# Patient Record
Sex: Female | Born: 1935 | Race: White | Hispanic: No | Smoking: Never smoker
Health system: Southern US, Community
[De-identification: ages and names within clinical notes are randomized; demographics above are authoritative.]

## PROBLEM LIST (undated history)

## (undated) DIAGNOSIS — M1990 Unspecified osteoarthritis, unspecified site: Secondary | ICD-10-CM

## (undated) DIAGNOSIS — I341 Nonrheumatic mitral (valve) prolapse: Secondary | ICD-10-CM

## (undated) DIAGNOSIS — R079 Chest pain, unspecified: Secondary | ICD-10-CM

## (undated) DIAGNOSIS — N3010 Interstitial cystitis (chronic) without hematuria: Secondary | ICD-10-CM

## (undated) DIAGNOSIS — I10 Essential (primary) hypertension: Secondary | ICD-10-CM

## (undated) HISTORY — DX: Essential (primary) hypertension: I10

## (undated) HISTORY — DX: Interstitial cystitis (chronic) without hematuria: N30.10

## (undated) HISTORY — DX: Nonrheumatic mitral (valve) prolapse: I34.1

## (undated) HISTORY — DX: Chest pain, unspecified: R07.9

## (undated) HISTORY — DX: Unspecified osteoarthritis, unspecified site: M19.90

---

## 1999-02-26 ENCOUNTER — Other Ambulatory Visit: Admission: RE | Admit: 1999-02-26 | Discharge: 1999-02-26 | Payer: Self-pay | Admitting: *Deleted

## 2000-04-10 ENCOUNTER — Other Ambulatory Visit: Admission: RE | Admit: 2000-04-10 | Discharge: 2000-04-10 | Payer: Self-pay | Admitting: *Deleted

## 2002-01-21 HISTORY — PX: CARDIAC CATHETERIZATION: SHX172

## 2002-05-26 ENCOUNTER — Ambulatory Visit (HOSPITAL_COMMUNITY): Admission: RE | Admit: 2002-05-26 | Discharge: 2002-05-26 | Payer: Self-pay | Admitting: Cardiology

## 2004-09-17 ENCOUNTER — Ambulatory Visit: Payer: Self-pay | Admitting: Internal Medicine

## 2004-09-28 ENCOUNTER — Ambulatory Visit: Payer: Self-pay | Admitting: Internal Medicine

## 2004-09-28 ENCOUNTER — Encounter (INDEPENDENT_AMBULATORY_CARE_PROVIDER_SITE_OTHER): Payer: Self-pay | Admitting: Specialist

## 2004-12-27 ENCOUNTER — Ambulatory Visit: Payer: Self-pay | Admitting: Internal Medicine

## 2005-01-18 ENCOUNTER — Ambulatory Visit: Payer: Self-pay | Admitting: Internal Medicine

## 2005-01-18 ENCOUNTER — Encounter (INDEPENDENT_AMBULATORY_CARE_PROVIDER_SITE_OTHER): Payer: Self-pay | Admitting: Specialist

## 2006-03-21 ENCOUNTER — Ambulatory Visit: Payer: Self-pay | Admitting: Internal Medicine

## 2006-04-04 ENCOUNTER — Ambulatory Visit: Payer: Self-pay | Admitting: Internal Medicine

## 2006-04-04 ENCOUNTER — Encounter (INDEPENDENT_AMBULATORY_CARE_PROVIDER_SITE_OTHER): Payer: Self-pay | Admitting: *Deleted

## 2008-03-25 ENCOUNTER — Encounter (INDEPENDENT_AMBULATORY_CARE_PROVIDER_SITE_OTHER): Payer: Self-pay | Admitting: *Deleted

## 2008-04-21 ENCOUNTER — Ambulatory Visit: Payer: Self-pay | Admitting: Internal Medicine

## 2008-05-06 ENCOUNTER — Encounter: Payer: Self-pay | Admitting: Internal Medicine

## 2008-05-06 ENCOUNTER — Ambulatory Visit: Payer: Self-pay | Admitting: Internal Medicine

## 2008-05-09 ENCOUNTER — Encounter: Payer: Self-pay | Admitting: Internal Medicine

## 2009-08-11 ENCOUNTER — Ambulatory Visit (HOSPITAL_COMMUNITY): Admission: EM | Admit: 2009-08-11 | Discharge: 2009-08-11 | Payer: Self-pay | Admitting: Emergency Medicine

## 2009-09-07 ENCOUNTER — Encounter: Payer: Self-pay | Admitting: Internal Medicine

## 2009-09-12 ENCOUNTER — Encounter: Payer: Self-pay | Admitting: Internal Medicine

## 2010-04-07 LAB — COMPREHENSIVE METABOLIC PANEL
ALT: 15 U/L (ref 0–35)
AST: 25 U/L (ref 0–37)
Albumin: 3.7 g/dL (ref 3.5–5.2)
Alkaline Phosphatase: 50 U/L (ref 39–117)
BUN: 12 mg/dL (ref 6–23)
CO2: 24 mEq/L (ref 19–32)
Calcium: 9.5 mg/dL (ref 8.4–10.5)
Chloride: 115 mEq/L — ABNORMAL HIGH (ref 96–112)
Creatinine, Ser: 0.71 mg/dL (ref 0.4–1.2)
GFR calc Af Amer: >60 mL/min (ref >60)
GFR calc non Af Amer: >60 mL/min (ref >60)
Glucose, Bld: 98 mg/dL (ref 70–99)
Potassium: 3.6 mEq/L (ref 3.5–5.1)
Sodium: 144 mEq/L (ref 135–145)
Total Bilirubin: 0.1 mg/dL — ABNORMAL LOW (ref 0.3–1.2)
Total Protein: 6.3 g/dL (ref 6.0–8.3)

## 2010-04-07 LAB — CBC
HCT: 39.8 % (ref 36.0–46.0)
Hemoglobin: 13.9 g/dL (ref 12.0–15.0)
MCH: 32.1 pg (ref 26.0–34.0)
MCHC: 34.8 g/dL (ref 30.0–36.0)
MCV: 92.1 fL (ref 78.0–100.0)
Platelets: 261 10*3/uL (ref 150–400)
RBC: 4.32 MIL/uL (ref 3.87–5.11)
RDW: 14.5 % (ref 11.5–15.5)
WBC: 9.6 10*3/uL (ref 4.0–10.5)

## 2010-05-28 ENCOUNTER — Encounter: Payer: Self-pay | Admitting: *Deleted

## 2010-06-01 ENCOUNTER — Ambulatory Visit (INDEPENDENT_AMBULATORY_CARE_PROVIDER_SITE_OTHER): Payer: Medicare Other | Admitting: Cardiology

## 2010-06-01 ENCOUNTER — Encounter: Payer: Self-pay | Admitting: Cardiology

## 2010-06-01 DIAGNOSIS — I059 Rheumatic mitral valve disease, unspecified: Secondary | ICD-10-CM

## 2010-06-01 DIAGNOSIS — R079 Chest pain, unspecified: Secondary | ICD-10-CM

## 2010-06-01 DIAGNOSIS — N3010 Interstitial cystitis (chronic) without hematuria: Secondary | ICD-10-CM | POA: Insufficient documentation

## 2010-06-01 DIAGNOSIS — I10 Essential (primary) hypertension: Secondary | ICD-10-CM | POA: Insufficient documentation

## 2010-06-01 DIAGNOSIS — I341 Nonrheumatic mitral (valve) prolapse: Secondary | ICD-10-CM

## 2010-06-01 DIAGNOSIS — M1990 Unspecified osteoarthritis, unspecified site: Secondary | ICD-10-CM | POA: Insufficient documentation

## 2010-06-01 NOTE — Assessment & Plan Note (Signed)
She has chronic nonspecific ST and T-wave changes in her EKG is unchanged today. She's not having recurrent angina but does have some dyspnea on exertion

## 2010-06-01 NOTE — Progress Notes (Signed)
Subjective:   Jennifer Hunt comes in today for followup visit. Overall, she's been doing well and exercise is regular. She's had occasional episode of vertigo and occasional dizziness but in general is able to exercise without complaint.  Current Outpatient Prescriptions  Medication Sig Dispense Refill  . Calcium Carbonate-Vit D-Min (CALTRATE PLUS PO) Take by mouth daily.        . cyclobenzaprine (FLEXERIL) 10 MG tablet Take 10 mg by mouth 3 (three) times daily as needed.        Marland Kitchen ibuprofen (ADVIL,MOTRIN) 200 MG tablet Take 200 mg by mouth every 6 (six) hours as needed.        . multivitamin-iron-minerals-folic acid (CENTRUM) chewable tablet Chew 1 tablet by mouth daily.        . nebivolol (BYSTOLIC) 5 MG tablet Take 5 mg by mouth daily.        . Omega-3 Fatty Acids (FISH OIL CONCENTRATE) 435 MG CAPS Take by mouth daily.        Marland Kitchen DISCONTD: Fish Oil OIL by Does not apply route.        Marland Kitchen DISCONTD: Ibuprofen (MOTRIN IB PO) Take by mouth as needed.        Marland Kitchen DISCONTD: Olopatadine HCl (PATADAY OP) Apply to eye as needed.          Allergies  Allergen Reactions  . Tramadol Hcl     REACTION: Nausea and vomitting    Patient Active Problem List  Diagnoses  . Chest pain, unspecified  . Mitral valve prolapse  . Arthritis  . Interstitial cystitis  . Hypertension    History  Smoking status  . Never Smoker   Smokeless tobacco  . Not on file    History  Alcohol Use No    Family History  Problem Relation Age of Onset  . Cancer Father     Review of Systems:   The patient denies any heat or cold intolerance.  No weight gain or weight loss.  The patient denies headaches or blurry vision.  There is no cough or sputum production.  The patient denies dizziness.  There is no hematuria or hematochezia.  The patient denies any muscle aches or arthritis.  The patient denies any rash.  The patient denies frequent falling or instability.  There is no history of depression or anxiety.  All other systems  were reviewed and are negative.   Physical Exam:   Blood pressure is 132/90. Heart rate 68. Weight is 139 pounds. She does have a soft midsystolic click but no murmur.The head is normocephalic and atraumatic.  Pupils are equally round and reactive to light.  Sclerae nonicteric.  Conjunctiva is clear.  Oropharynx is unremarkable.  There's adequate oral airway.  Neck is supple there are no masses.  Thyroid is not enlarged.  There is no lymphadenopathy.  Lungs are clear.  Chest is symmetric.  Heart shows a regular rate and rhythm.  S1 and S2 are normal.  There is no murmur.  Abdomen is soft normal bowel sounds.  There is no organomegaly.  Genital and rectal deferred.  Extremities are without edema.  Peripheral pulses are adequate.  Neurologically intact.  Full range of motion.  The patient is not depressed.  Skin is warm and dry.  Assessment / Plan:

## 2010-06-01 NOTE — Assessment & Plan Note (Signed)
In general, she's been doing well. She doesn't have a murmur today and I will not repeat her echocardiogram. I will have her see Dietrich Pates in one year for followup of her mitral valve prolapse

## 2010-06-04 ENCOUNTER — Other Ambulatory Visit: Payer: Self-pay | Admitting: Cardiology

## 2010-06-04 DIAGNOSIS — I10 Essential (primary) hypertension: Secondary | ICD-10-CM

## 2010-06-04 MED ORDER — NEBIVOLOL HCL 5 MG PO TABS: 5.0000 mg | ORAL_TABLET | Freq: Every day | ORAL | Status: DC

## 2010-06-04 NOTE — Telephone Encounter (Signed)
DOES SHE STILL NEED TO TAKE BYSTOLIC 5MG . IF SO NEEDS REFILL. [facility] AT [facility] [address]

## 2010-06-08 NOTE — H&P (Signed)
NAMECARRON, Jennifer Hunt                          ACCOUNT NO.:  [id number]   MEDICAL RECORD NO.:  [id number]                   PATIENT TYPE:  OIB   LOCATION:                                       FACILITY:  [facility]   PHYSICIAN:  Colleen Can. Deborah Chalk, M.D.            DATE OF BIRTH:  1927/04/18   DATE OF ADMISSION:  05/26/2002  DATE OF DISCHARGE:                                HISTORY & PHYSICAL   CHIEF COMPLAINT:  Chest pain.   HISTORY OF PRESENT ILLNESS:  The patient is a 77 year old white female who  was referred to our office for evaluation on 05/21/02 with recurrent episodes  of chest pain.  She notes that her first episode basically occurred while  she was getting dressed in the bathroom on 05/09/02.  It was located under  the left breast.  It radiated up into the shoulder and somewhat down the  arm.  It was of a moderate severe nature.  It was described more as a heavy-  like sensation.  It lasted for approximately six hours.  She has no  associated symptoms.  She has had some recurrent twinges, what she describes  as heaviness, since that time, which last for a few minutes.  She saw Dr.  Ivery Quale on 05/17/02 who placed her on Norvasc and Protonix.  With  these medicines, she has continued to have some complaints.  EKG performed  in his office showed a new T wave in lead V2.  She is now referred for  elective cardiac catheterization.   PAST MEDICAL HISTORY:  1. Chest pain with a negative Cardiolite study in 7/03.  2. Hyperlipidemia.  3. Mitral valve prolapse with last 2-D echocardiogram in 7/03 showing trace     to mild mitral regurgitation and mild tricuspid regurgitation with well-     preserved left ventricular function.  4. Significant situational stress.  5. Generalized arthritis.   ALLERGIES:  ULTRAM.   CURRENT MEDICATIONS:  1. Fosamax weekly.  2. Celebrex daily.  3. Calcium daily.  4. Vitamin E daily.  5. Protonix 40 mg daily.  6. Norvasc 5 mg daily.   FAMILY  HISTORY:  Her mother is alive and is 78 years old.  She does have a  history of angina and has a functioning pacemaker.  Her father died at 26 of  cancer.   SOCIAL HISTORY:  She is a widow.  She is currently caring for her invalid  mother.  She has no alcohol or tobacco use.   REVIEW OF SYSTEMS:  As noted above, and is otherwise unremarkable.   PHYSICAL EXAMINATION:  GENERAL:  She is a very pleasant white female who  appears younger than her stated age.  VITAL SIGNS:  Blood pressure 170/70 sitting, 150/90 standing.  Heart rate is  80.  SKIN:  Warm and dry.  Color is unremarkable.  HEENT:  Unremarkable.  LUNGS:  Clear.  HEART:  Regular rhythm.  ABDOMEN:  Soft, positive bowel sounds, nontender.  EXTREMITIES:  Without edema.  NEUROLOGIC:  Intact with no gross focal deficits.   LABORATORY DATA:  Pending.   IMPRESSION:  1. Continued bouts of chest discomfort of questionable etiology.  2. Hyperlipidemia.  3. Hypertension.  4. Positive family history.   PLAN:  Will proceed on with cardiac catheterization.  The procedure has been  discussed in full detail, and she is willing to proceed.     Juanell Fairly C. Earl Gala, N.P.                 Colleen Can. Deborah Chalk, M.D.    LCO/MEDQ  D:  05/21/2002  T:  05/21/2002  Job:  [id number]   cc:   Brunilda Payor, M.D.

## 2010-06-08 NOTE — Cardiovascular Report (Signed)
   NAMETAILER, VOLKERT                          ACCOUNT NO.:  [id number]   MEDICAL RECORD NO.:  [id number]                   PATIENT TYPE:  OIB   LOCATION:  2899                                 FACILITY:  [facility]   PHYSICIAN:  Colleen Can. Deborah Chalk, M.D.            DATE OF BIRTH:  Nov 25, 1927   DATE OF PROCEDURE:  DATE OF DISCHARGE:                              CARDIAC CATHETERIZATION   ADDENDUM   Please send a copy of dictation [phone] to Dr. Barry Dienes. [place].  I do not  think I put the referring physician name on that dictation.                                               Colleen Can. Deborah Chalk, M.D.    SNT/MEDQ  D:  05/26/2002  T:  05/27/2002  Job:  [id number]   cc:   Brunilda Payor, M.D,

## 2010-06-08 NOTE — Cardiovascular Report (Signed)
   Jennifer Hunt, Jennifer Hunt                          ACCOUNT NO.:  [id number]   MEDICAL RECORD NO.:  [id number]                   PATIENT TYPE:  OIB   LOCATION:  2899                                 FACILITY:  [facility]   PHYSICIAN:  Colleen Can. Deborah Chalk, M.D.            DATE OF BIRTH:  1927-06-04   DATE OF PROCEDURE:  05/26/2002  DATE OF DISCHARGE:  05/26/2002                              CARDIAC CATHETERIZATION   PROCEDURE:  Left heart catheterization with selective coronary angiography,  left ventricular angiography.   CARDIOLOGIST:  Colleen Can. Deborah Chalk, M.D.   TYPE AND SITE OF ENTRY:  Percutaneous right femoral artery, percutaneous  right femoral vein.   CATHETERS:  A 6 French 4 curved Judkins right and left coronary catheters, 6  French pigtail ventriculographic catheter,   CONTRAST MATERIAL:  Omnipaque   MEDICATIONS GIVEN PRIOR TO THE PROCEDURE:  Versed 2 mg IV.   COMMENTS:  The patient tolerated the procedure well.   HEMODYNAMIC DATA:  The aortic pressure was 120/72.  The LV pressure was  130/5-10.  There was no aortic valve gradient noted on pullback.   CORONARY ARTERIES:  1. The left main coronary artery was normal.  2. Left anterior descending has a 10-20% narrowing proximally.  It is a     reasonably large vessel that extends around the apex.  3. The left circumflex is a relatively large vessel with large obtuse     marginal branch.  It is normal.  4. Right coronary artery is a dominant vessel.  It is normal.   LEFT VENTRICULAR ANGIOGRAM:  Left ventricular angiogram was performed in the  RAO position.  The overall cardiac size and silhouette were normal.  Global  left ventricular function is normal with an ejection fraction was 55-60%.   OVERALL IMPRESSION:  1. Very minimal coronary atherosclerosis in the proximal left anterior     descending but normal coronaries otherwise.  2. Normal left ventricular function.            DISCUSSION:  It is felt that any  chest pain is not related to coronary  atherosclerosis at this point in time.                                               Colleen Can. Deborah Chalk, M.D.   SNT/MEDQ  D:  05/26/2002  T:  05/27/2002  Job:  [id number]    Cc:  Ivery Quale, M.D.

## 2010-07-12 ENCOUNTER — Encounter: Payer: Self-pay | Admitting: Internal Medicine

## 2010-07-12 ENCOUNTER — Ambulatory Visit (INDEPENDENT_AMBULATORY_CARE_PROVIDER_SITE_OTHER): Payer: Medicare Other | Admitting: Internal Medicine

## 2010-07-12 DIAGNOSIS — R143 Flatulence: Secondary | ICD-10-CM

## 2010-07-12 DIAGNOSIS — R1084 Generalized abdominal pain: Secondary | ICD-10-CM

## 2010-07-12 DIAGNOSIS — Z86010 Personal history of colonic polyps: Secondary | ICD-10-CM

## 2010-07-12 DIAGNOSIS — R141 Gas pain: Secondary | ICD-10-CM

## 2010-07-12 MED ORDER — PEG-KCL-NACL-NASULF-NA ASC-C 100 G PO SOLR: 1.0000 | Freq: Once | ORAL | Status: DC

## 2010-07-12 NOTE — Patient Instructions (Signed)
[facility] 08/24/10 3:00 pm arrive at 2:00 pm on 4th floor Moviprep prescription sent to your pharmacy Colon brochure given for you to review.

## 2010-07-12 NOTE — Progress Notes (Signed)
HISTORY OF PRESENT ILLNESS:  Jennifer Hunt is a 77 y.o. female with the below listed medical history. She presents today regarding surveillance colonoscopy. Also issues with lower abdominal discomfort and bloating. Her index colonoscopy was performed in September of 2006. She was found to have multiple large colon polyps which were removed. She had advanced pathology in one lesion, high-grade dysplasia. She has had multiple followup exams since, with adenomatous polypectomies on each occasion. Her last procedure was performed in March of 2008. Followup in 2 years recommended. A recall letter sent. She now reports a several month history of intermittent lower abdominal discomfort associated with bloating and gas. Symptoms seem to be predictably relieved with defecation. She denies other symptoms such as nausea, vomiting, bleeding, or weight loss. Meals to not affect symptoms. Otherwise she has been doing well. She did fracture her wrists last summer, but has recovered nicely. She remains active and exercises at the gymnasium daily. Her mother lived to be 2 years old. GI review of systems is otherwise negative  REVIEW OF SYSTEMS:  All non-GI ROS negative except for allergies, arthritis, back pain, mild hearing impairment.  Past Medical History  Diagnosis Date  . Mitral valve prolapse   . Arthritis   . Interstitial cystitis   . Chest pain, unspecified   . Hypertension     Past Surgical History  Procedure Date  . Cardiac catheterization 2004    Social History [name]  reports that she has never smoked. She does not have any smokeless tobacco history on file. She reports that she does not drink alcohol or use illicit drugs.  family history includes Cancer in her father; Heart disease in her maternal grandfather and mother; and Stroke in her maternal grandmother.  Allergies  Allergen Reactions  . Tramadol Hcl     REACTION: Nausea and vomitting       PHYSICAL  EXAMINATION: Vital signs: BP 164/90  Pulse 84  Ht 5\' 4"  (1.626 m)  Wt 138 lb 6.4 oz (62.778 kg)  BMI 23.76 kg/m2 General: Well-developed, well-nourished, no acute distress HEENT: Sclerae are anicteric, conjunctiva pink. Oral mucosa intact Lungs: Clear Heart: Regular Abdomen: soft, nontender, nondistended, no obvious ascites, no peritoneal signs, normal bowel sounds. No organomegaly. Extremities: No edema Psychiatric: alert and oriented x3. Cooperative    ASSESSMENT:  #1. Problems with lower abdominal discomfort and gas, relieved with defecation. No worrisome features at this point. Maybe spasm, and the patient with known diverticular disease. Upcoming colonoscopy to exclude other problems. #2. History of multiple and advanced adenomatous polyps. Due for followup. Appropriate candidate without contraindication   PLAN:  #1. Probiotic trial #2. Colonoscopy.The nature of the procedure, as well as the risks, benefits, and alternatives were carefully and thoroughly reviewed with the patient. Ample time for discussion and questions allowed. The patient understood, was satisfied, and agreed to proceed. Movi prep prescribed. The patient instructed on its use

## 2010-07-13 ENCOUNTER — Other Ambulatory Visit: Payer: Self-pay | Admitting: Internal Medicine

## 2010-07-13 ENCOUNTER — Encounter: Payer: Self-pay | Admitting: Internal Medicine

## 2010-08-24 ENCOUNTER — Ambulatory Visit (AMBULATORY_SURGERY_CENTER): Payer: Medicare Other | Admitting: Internal Medicine

## 2010-08-24 ENCOUNTER — Encounter: Payer: Self-pay | Admitting: Internal Medicine

## 2010-08-24 VITALS — BP 140/65 | HR 61 | Temp 97.0°F | Resp 21 | Ht 64.0 in | Wt 139.0 lb

## 2010-08-24 DIAGNOSIS — Z1211 Encounter for screening for malignant neoplasm of colon: Secondary | ICD-10-CM

## 2010-08-24 DIAGNOSIS — R1084 Generalized abdominal pain: Secondary | ICD-10-CM

## 2010-08-24 DIAGNOSIS — Z86010 Personal history of colonic polyps: Secondary | ICD-10-CM

## 2010-08-24 DIAGNOSIS — R109 Unspecified abdominal pain: Secondary | ICD-10-CM

## 2010-08-24 DIAGNOSIS — D126 Benign neoplasm of colon, unspecified: Secondary | ICD-10-CM

## 2010-08-24 MED ORDER — SODIUM CHLORIDE 0.9 % IV SOLN: 500.0000 mL | Freq: Once | INTRAVENOUS | Status: DC

## 2010-08-24 NOTE — Progress Notes (Signed)
Pt to the restroom with Kathrynn Speed, rn at her side.  Nocompalints on d/c.  Pt requested ice chips on d/c. MAW

## 2010-08-24 NOTE — Progress Notes (Signed)
PT HAS RED MARK/IMPRINT ON BUTTOCKS FROM BEING ON THE BED PAN IN ADMITTING. PT STATES IT DOESN'T HURT. SKIN NOT BROKEN. EWM  PT NOTED TO HAVE AN OCCASIONAL PAC THRU OUT PROCEDURE. MD AWARE, NO ORDERS GIVEN. EWM

## 2010-08-24 NOTE — Patient Instructions (Signed)
Refer to the picture page for the results of your colonoscopy.  Please follow the green and blue discharge instruction sheets for the rest of the day.  You may resume your prior medications today.  Call if any questions or concerns.

## 2010-08-27 ENCOUNTER — Telehealth: Payer: Self-pay | Admitting: *Deleted

## 2010-08-27 NOTE — Telephone Encounter (Signed)

## 2010-09-19 ENCOUNTER — Encounter: Payer: Self-pay | Admitting: Internal Medicine

## 2011-05-22 ENCOUNTER — Other Ambulatory Visit: Payer: Self-pay | Admitting: Internal Medicine

## 2011-05-22 DIAGNOSIS — E041 Nontoxic single thyroid nodule: Secondary | ICD-10-CM

## 2011-06-03 ENCOUNTER — Ambulatory Visit (INDEPENDENT_AMBULATORY_CARE_PROVIDER_SITE_OTHER): Payer: Medicare Other | Admitting: Internal Medicine

## 2011-06-03 ENCOUNTER — Encounter: Payer: Self-pay | Admitting: Internal Medicine

## 2011-06-03 VITALS — BP 140/80 | HR 82 | Ht 63.5 in | Wt 140.8 lb

## 2011-06-03 DIAGNOSIS — I10 Essential (primary) hypertension: Secondary | ICD-10-CM

## 2011-06-03 NOTE — Progress Notes (Addendum)
HPI Patient is an 78 year old with a history of MVP adn HTN  She was previously followed by Ronnald Nian.   SHe goes to work outs at [employer] Uses recumbant bike for 15 min per day Still has occasional sinking feelings.  Felt faint in past while driving.  No syncope.  W/u done by Ronnald Nian.  Reported negative. Fainted once looking up at [employer] board [place] [employer].  2 to 3 years ago.     A few wks ago watching movie.  Felt dizzy.  Room spinning.  Went home  Drove.  Got in recliner  Seen by ENT  Felt to be vertigo. Resolved BP at home 110s to 120s. Chol panel in Sept 2012:  LDL 149, HDL 49 Allergies  Allergen Reactions  . Tramadol Hcl     REACTION: Nausea and vomitting    Current Outpatient Prescriptions  Medication Sig Dispense Refill  . Alpha-D-Galactosidase (BEANO PO) Take 3 tablets by mouth as needed.        . Calcium Carbonate-Simethicone (MAALOX MAX PO) Take by mouth. Take 1-2 tablespoons by mouth as needed       . Calcium Carbonate-Vit D-Min (CALTRATE PLUS PO) Take by mouth daily.        . cyclobenzaprine (FLEXERIL) 10 MG tablet Take 10 mg by mouth 3 (three) times daily as needed.        Marland Kitchen ibuprofen (ADVIL,MOTRIN) 200 MG tablet Take 200 mg by mouth every 6 (six) hours as needed.        . multivitamin-iron-minerals-folic acid (CENTRUM) chewable tablet Chew 1 tablet by mouth daily.        . nebivolol (BYSTOLIC) 5 MG tablet Take 1 tablet (5 mg total) by mouth daily.  30 tablet  11  . Omega-3 Fatty Acids (FISH OIL CONCENTRATE) 435 MG CAPS Take by mouth daily.        . Simethicone (GAS-X PO) Take 1 capsule by mouth as needed.          Past Medical History  Diagnosis Date  . Mitral valve prolapse   . Arthritis   . Interstitial cystitis   . Chest pain, unspecified   . Hypertension     Past Surgical History  Procedure Date  . Cardiac catheterization 2004    Family History  Problem Relation Age of Onset  . Cancer Father     unsure as to what kind  . Heart disease Mother   . Heart  disease Maternal Grandfather   . Stroke Maternal Grandmother     History   Social History  . Marital Status: Widowed    Spouse Name: N/A    Number of Children: N/A  . Years of Education: N/A   Occupational History  . Not on file.   Social History Main Topics  . Smoking status: Never Smoker   . Smokeless tobacco: Not on file  . Alcohol Use: No  . Drug Use: No  . Sexually Active: Not on file   Other Topics Concern  . Not on file   Social History Narrative  . No narrative on file    Review of Systems:  All systems reviewed.  They are negative to the above problem except as previously stated.  Vital Signs: There were no vitals taken for this visit.  Physical Exam Patient is in NAD HEENT:  Normocephalic, atraumatic. EOMI, PERRLA.  Neck: JVP is normal. No thyromegaly. No bruits.  Lungs: clear to auscultation. No rales no wheezes.  Heart: Regular  rate and rhythm. Normal S1, S2. No S3.   No significant murmurs. PMI not displaced.  Abdomen:  Supple, nontender. Normal bowel sounds. No masses. No hepatomegaly.  Extremities:   Good distal pulses throughout. No lower extremity edema.  Musculoskeletal :moving all extremities.  Neuro:   alert and oriented x3.  CN II-XII grossly intact.  EKG:  SR  82 bpm  Occasional PAC and PVC.  Assessment and Plan: 1.  HTN  BP at home is good.  Continue bystolic 2.  MVP  No murmur on exam 3.  Hx syncope.  None since 4.  Vertigo.  Positional  5.  Thyroid  For FNA 6.  Chol  Patient refuses to take anything for her chol.  Her mother lived a long life.  Keep on current diet.  Can take an extra fish oil

## 2011-06-03 NOTE — Patient Instructions (Signed)
Your physician wants you to follow-up in:  12 months.  You will receive a reminder letter in the mail two months in advance. If you don't receive a letter, please call our office to schedule the follow-up appointment.   

## 2011-06-04 ENCOUNTER — Ambulatory Visit
Admission: RE | Admit: 2011-06-04 | Discharge: 2011-06-04 | Disposition: A | Discharge status: Home / Self Care | Payer: Medicare Other | Source: Ambulatory Visit | Attending: Internal Medicine | Admitting: Internal Medicine

## 2011-06-04 ENCOUNTER — Other Ambulatory Visit (HOSPITAL_COMMUNITY)
Admission: RE | Admit: 2011-06-04 | Discharge: 2011-06-04 | Disposition: A | Discharge status: Home / Self Care | Payer: Medicare Other | Source: Ambulatory Visit | Attending: Interventional Radiology | Admitting: Interventional Radiology

## 2011-06-04 DIAGNOSIS — E041 Nontoxic single thyroid nodule: Secondary | ICD-10-CM | POA: Insufficient documentation

## 2011-06-04 NOTE — Progress Notes (Signed)
Patient ID: Jennifer Hunt, female   DOB: 1927-01-26, 78 y.o.   MRN: [id number]

## 2011-07-01 ENCOUNTER — Other Ambulatory Visit: Payer: Self-pay | Admitting: Cardiology

## 2011-07-01 NOTE — Telephone Encounter (Signed)
..   Requested Prescriptions   Pending Prescriptions Disp Refills  . BYSTOLIC 5 MG tablet [Pharmacy Med Name: BYSTOLIC 5 MG TABLET] 30 tablet 6    Sig: take 1 tablet by mouth once daily

## 2011-08-14 ENCOUNTER — Ambulatory Visit: Payer: Medicare Other | Attending: Internal Medicine | Admitting: Physical Therapy

## 2011-08-14 DIAGNOSIS — IMO0001 Reserved for inherently not codable concepts without codable children: Secondary | ICD-10-CM | POA: Insufficient documentation

## 2011-08-14 DIAGNOSIS — R42 Dizziness and giddiness: Secondary | ICD-10-CM | POA: Insufficient documentation

## 2011-08-21 ENCOUNTER — Ambulatory Visit: Payer: Medicare Other | Admitting: Physical Therapy

## 2011-08-23 ENCOUNTER — Ambulatory Visit: Payer: Medicare Other | Attending: Internal Medicine | Admitting: Physical Therapy

## 2011-08-23 DIAGNOSIS — IMO0001 Reserved for inherently not codable concepts without codable children: Secondary | ICD-10-CM | POA: Insufficient documentation

## 2011-08-23 DIAGNOSIS — R42 Dizziness and giddiness: Secondary | ICD-10-CM | POA: Insufficient documentation

## 2011-08-26 ENCOUNTER — Ambulatory Visit: Payer: Medicare Other | Admitting: Physical Therapy

## 2011-08-28 ENCOUNTER — Ambulatory Visit: Payer: Medicare Other | Admitting: *Deleted

## 2011-09-02 ENCOUNTER — Ambulatory Visit: Payer: Medicare Other | Admitting: *Deleted

## 2011-09-04 ENCOUNTER — Ambulatory Visit: Payer: Medicare Other | Admitting: Physical Therapy

## 2011-09-09 ENCOUNTER — Ambulatory Visit: Payer: Medicare Other | Admitting: Physical Therapy

## 2011-09-11 ENCOUNTER — Ambulatory Visit: Payer: Medicare Other | Admitting: Physical Therapy

## 2012-01-31 ENCOUNTER — Other Ambulatory Visit: Payer: Self-pay | Admitting: *Deleted

## 2012-01-31 MED ORDER — NEBIVOLOL HCL 5 MG PO TABS: 5.0000 mg | ORAL_TABLET | Freq: Every day | ORAL | Status: DC

## 2012-05-25 ENCOUNTER — Ambulatory Visit (INDEPENDENT_AMBULATORY_CARE_PROVIDER_SITE_OTHER): Payer: Medicare Other | Admitting: Internal Medicine

## 2012-05-25 VITALS — BP 136/70 | HR 72 | Ht 64.0 in | Wt 141.0 lb

## 2012-05-25 DIAGNOSIS — I10 Essential (primary) hypertension: Secondary | ICD-10-CM

## 2012-05-25 NOTE — Progress Notes (Addendum)
HPI Patient is an 79 yo with a history of HTN, MVP, syncope in past, vertigo and dyslipidemia I saw her in clinic 1 yar ago SInce seen she has done well  No CP  Breathing is good  Works out at sport time every day No syncope   Does not want to take anythng for cholesterol Allergies  Allergen Reactions  . Tramadol Hcl     REACTION:DIARRHEA and vomitting    Current Outpatient Prescriptions  Medication Sig Dispense Refill  . Alpha-D-Galactosidase (BEANO PO) Take 3 tablets by mouth as needed.        . Calcium Carbonate-Simethicone (MAALOX MAX PO) Take by mouth. Take 1-2 tablespoons by mouth as needed       . Calcium Carbonate-Vit D-Min (CALTRATE PLUS PO) Take by mouth daily.        . cyclobenzaprine (FLEXERIL) 10 MG tablet TAKE 1 TAB BY MOUTH AT BEDTIME IF NEEDED FOR MUSCLE SPASMS      . meclizine (ANTIVERT) 25 MG tablet 1 tab as needed for dizziness DUE TO VERTIGO      . multivitamin-iron-minerals-folic acid (CENTRUM) chewable tablet TAKE 1 TAB DAILY      . naproxen sodium (ANAPROX) 220 MG tablet Take 220 mg by mouth. AS NEEDED      . nebivolol (BYSTOLIC) 5 MG tablet Take 1 tablet (5 mg total) by mouth daily.  30 tablet  6  . Omega-3 Fatty Acids (FISH OIL CONCENTRATE) 435 MG CAPS Take by mouth daily.        . Simethicone (GAS-X PO) Take 1 capsule by mouth as needed.        . trolamine salicylate (ASPERCREME) 10 % cream Apply topically as needed.        No current facility-administered medications for this visit.    Past Medical History  Diagnosis Date  . Mitral valve prolapse   . Arthritis   . Interstitial cystitis   . Chest pain, unspecified   . Hypertension     Past Surgical History  Procedure Laterality Date  . Cardiac catheterization  2004    Family History  Problem Relation Age of Onset  . Cancer Father     unsure as to what kind  . Heart disease Mother   . Heart disease Maternal Grandfather   . Stroke Maternal Grandmother     History   Social History  .  Marital Status: Widowed    Spouse Name: N/A    Number of Children: N/A  . Years of Education: N/A   Occupational History  . Not on file.   Social History Main Topics  . Smoking status: Never Smoker   . Smokeless tobacco: Not on file  . Alcohol Use: No  . Drug Use: No  . Sexually Active: Not on file   Other Topics Concern  . Not on file   Social History Narrative  . No narrative on file    Review of Systems:  All systems reviewed.  They are negative to the above problem except as previously stated.  Vital Signs: BP 136/70  Pulse 72  Ht 5\' 4"  (1.626 m)  Wt 141 lb (63.957 kg)  BMI 24.19 kg/m2  Physical Exam Patient is in NAD HEENT:  Normocephalic, atraumatic. EOMI, PERRLA.  Neck: JVP is normal.  No bruits.  Lungs: clear to auscultation. No rales no wheezes.  Heart: Regular rate and rhythm. Normal S1, S2. No S3.   No significant murmurs. PMI not displaced.  Abdomen:  Supple, nontender. Normal  bowel sounds. No masses. No hepatomegaly.  Extremities:   Good distal pulses throughout. No lower extremity edema.  Musculoskeletal :moving all extremities.  Neuro:   alert and oriented x3.  CN II-XII grossly intact.  EKG  SR 72.  Nonspeific ST  Low voltage Assessment and Plan:  1.  HTN  Good control  2.  HL  Will get lipids from D Patterson's office  3.  HL  Wil check labs.

## 2012-09-02 ENCOUNTER — Other Ambulatory Visit: Payer: Self-pay

## 2012-09-02 MED ORDER — NEBIVOLOL HCL 5 MG PO TABS: 5.0000 mg | ORAL_TABLET | Freq: Every day | ORAL | Status: DC

## 2013-04-08 ENCOUNTER — Other Ambulatory Visit: Payer: Self-pay

## 2013-04-08 MED ORDER — NEBIVOLOL HCL 5 MG PO TABS: 5.0000 mg | ORAL_TABLET | Freq: Every day | ORAL | Status: DC

## 2013-05-30 NOTE — Progress Notes (Signed)
HPI Patient is an 80 yo with a history of HTN, MVP, syncope in past, vertigo and dyslipidemia I saw her in clinic 1 yr ago  Doing well  Notes occasional CP  Not associated with activity Has had vertigo  Not completely gone  No SOB  Goes to gym 3x per wk Can't squat  Stopped gardening.   Allergies  Allergen Reactions  . Tramadol Hcl     REACTION:DIARRHEA and vomitting    Current Outpatient Prescriptions  Medication Sig Dispense Refill  . Alpha-D-Galactosidase (BEANO PO) Take 3 tablets by mouth as needed.        . bismuth subsalicylate (PEPTO BISMOL) 262 MG/15ML suspension Take 30 mLs by mouth as needed.      . Calcium Carbonate-Vit D-Min (CALTRATE PLUS PO) Take by mouth daily.        . cyclobenzaprine (FLEXERIL) 10 MG tablet TAKE 1 TAB BY MOUTH AT BEDTIME IF NEEDED FOR MUSCLE SPASMS      . diphenhydramine-acetaminophen (TYLENOL PM) 25-500 MG TABS Take 1 tablet by mouth at bedtime as needed.      . meclizine (ANTIVERT) 25 MG tablet 1 tab as needed for dizziness DUE TO VERTIGO      . multivitamin-iron-minerals-folic acid (CENTRUM) chewable tablet TAKE 1 TAB DAILY      . naproxen sodium (ANAPROX) 220 MG tablet Take 220 mg by mouth. AS NEEDED      . nebivolol (BYSTOLIC) 5 MG tablet Take 1 tablet (5 mg total) by mouth daily.  30 tablet  3  . Omega-3 Fatty Acids (FISH OIL CONCENTRATE) 435 MG CAPS Take by mouth daily.        . Simethicone (GAS-X PO) Take 1 capsule by mouth as needed.        . trolamine salicylate (ASPERCREME) 10 % cream Apply topically as needed.        No current facility-administered medications for this visit.    Past Medical History  Diagnosis Date  . Mitral valve prolapse   . Arthritis   . Interstitial cystitis   . Chest pain, unspecified   . Hypertension     Past Surgical History  Procedure Laterality Date  . Cardiac catheterization  2004    Family History  Problem Relation Age of Onset  . Cancer Father     unsure as to what kind  . Heart disease  Mother   . Heart disease Maternal Grandfather   . Stroke Maternal Grandmother     History   Social History  . Marital Status: Widowed    Spouse Name: N/A    Number of Children: N/A  . Years of Education: N/A   Occupational History  . Not on file.   Social History Main Topics  . Smoking status: Never Smoker   . Smokeless tobacco: Not on file  . Alcohol Use: No  . Drug Use: No  . Sexual Activity: Not on file   Other Topics Concern  . Not on file   Social History Narrative  . No narrative on file    Review of Systems:  All systems reviewed.  They are negative to the above problem except as previously stated.  Vital Signs: BP 154/77  Pulse 79  Ht 5\' 4"  (1.626 m)  Wt 141 lb (63.957 kg)  BMI 24.19 kg/m2  Physical Exam Patient is in NAD HEENT:  Normocephalic, atraumatic. EOMI, PERRLA.  Neck: JVP is normal.  No bruits.  Lungs: clear to auscultation. No rales no wheezes.  Heart: Regular  rate and rhythm. Normal S1, S2. No S3.   No significant murmurs. PMI not displaced.  Abdomen:  Supple, nontender. Normal bowel sounds. No masses. No hepatomegaly.  Extremities:   Good distal pulses throughout. No lower extremity edema.  Musculoskeletal :moving all extremities.  Neuro:   alert and oriented x3.  CN II-XII grossly intact.  EKG  SR 79  Occaional PVC  Nonspecific ST T Wave changes   Assessment and Plan:  1.  HTN  Is a little high  SHe says it is better at home  I told her to check on different cuffs  2.  HL  Will get labs from Dr Sharlett Iles  Refuses meds.

## 2013-06-01 ENCOUNTER — Ambulatory Visit (INDEPENDENT_AMBULATORY_CARE_PROVIDER_SITE_OTHER): Payer: Medicare Other | Admitting: Internal Medicine

## 2013-06-01 ENCOUNTER — Encounter: Payer: Self-pay | Admitting: Internal Medicine

## 2013-06-01 VITALS — BP 154/77 | HR 79 | Ht 64.0 in | Wt 141.0 lb

## 2013-06-01 DIAGNOSIS — I10 Essential (primary) hypertension: Secondary | ICD-10-CM

## 2013-06-01 NOTE — Patient Instructions (Signed)
Your physician wants you to follow-up in 18 MONTHS WITH DR ROSS.  You will receive a reminder letter in the mail two months in advance. If you don't receive a letter, please call our office to schedule the follow-up appointment.  

## 2013-08-05 ENCOUNTER — Other Ambulatory Visit: Payer: Self-pay | Admitting: *Deleted

## 2013-08-05 MED ORDER — NEBIVOLOL HCL 5 MG PO TABS: 5.0000 mg | ORAL_TABLET | Freq: Every day | ORAL | Status: AC

## 2014-03-13 IMAGING — US US THYROID BIOPSY
1 series · 14 of 14 positions shown · non-contrast
Comparison: Outside comparisons

CLINICAL DATA: Dominant left inferior complex thyroid nodule

ULTRASOUND GUIDED NEEDLE ASPIRATE BIOPSY OF THE THYROID GLAND

[Series 1: us thyroid biopsy · 0.06mm/px · 14 acquisitions, 14 frames shown]
[im 1/14]
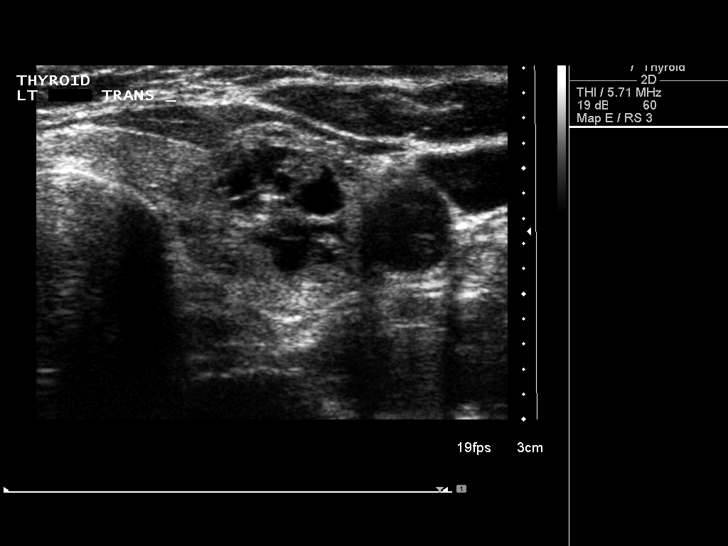
[im 2/14]
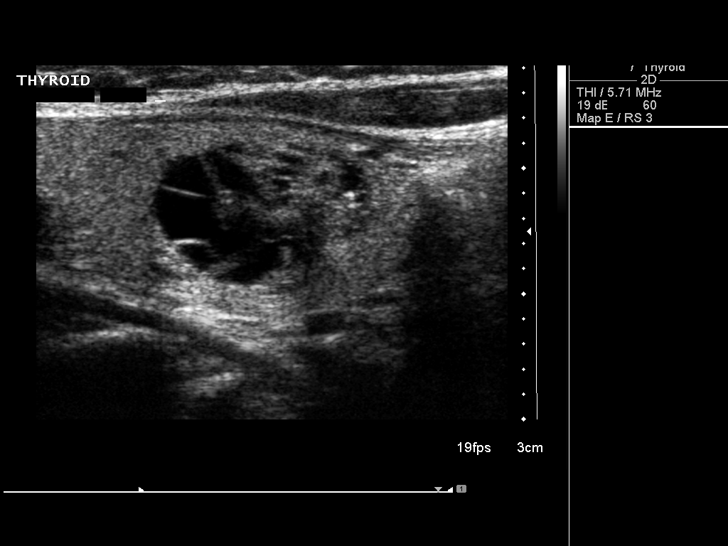
[im 3/14]
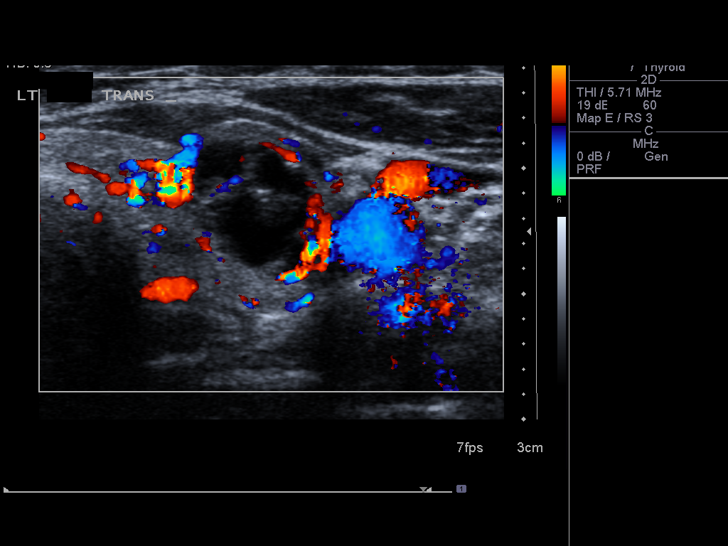
[im 4/14]
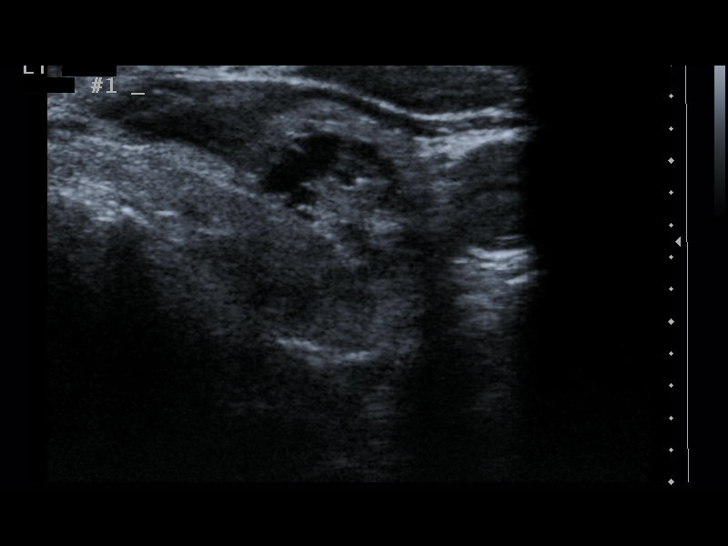
[im 5/14]
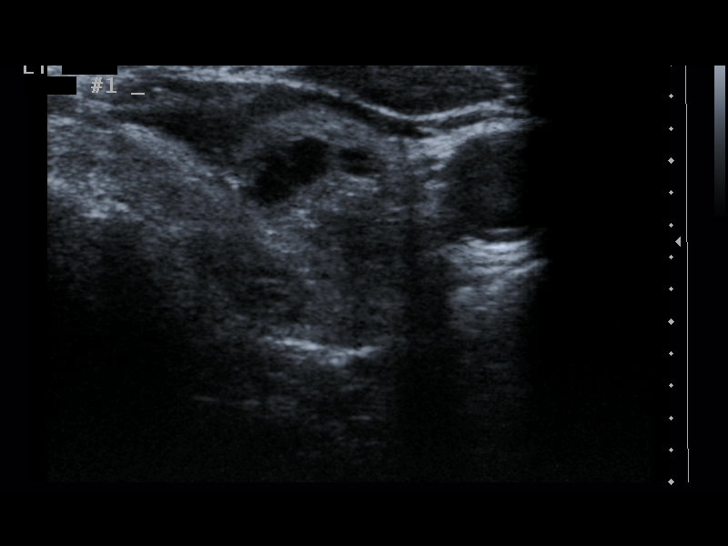
[im 6/14]
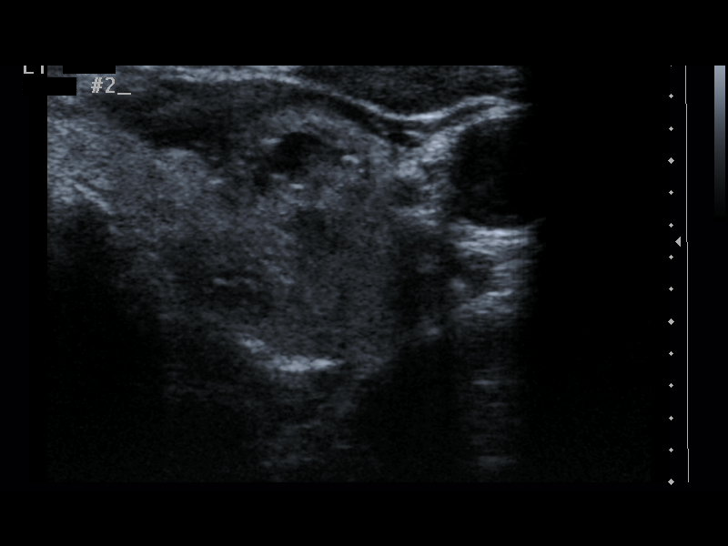
[im 7/14]
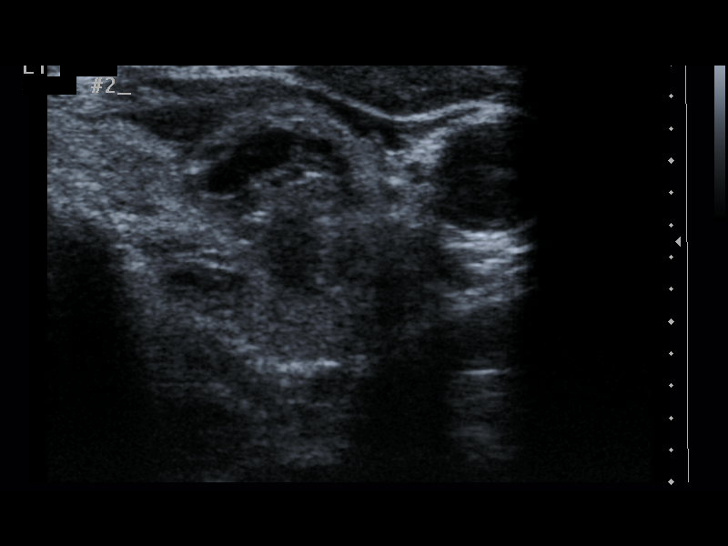
[im 8/14]
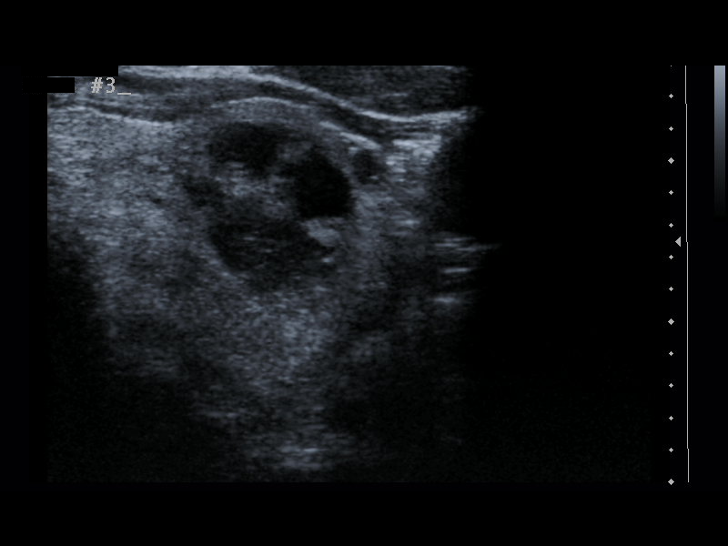
[im 9/14]
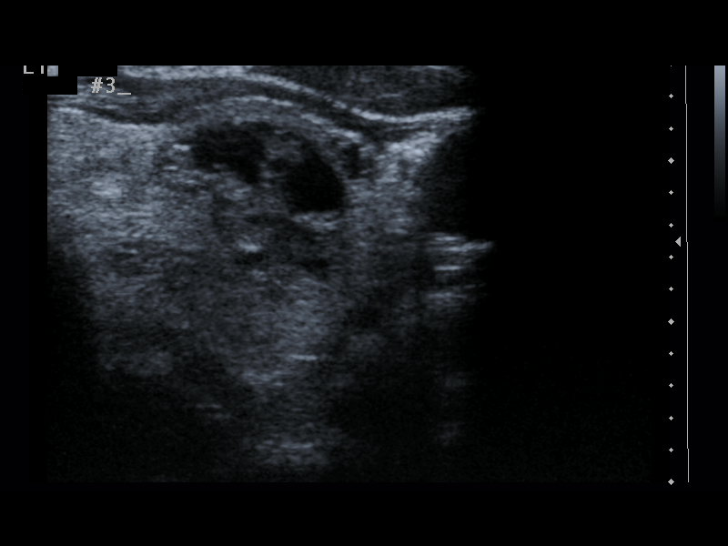
[im 10/14]
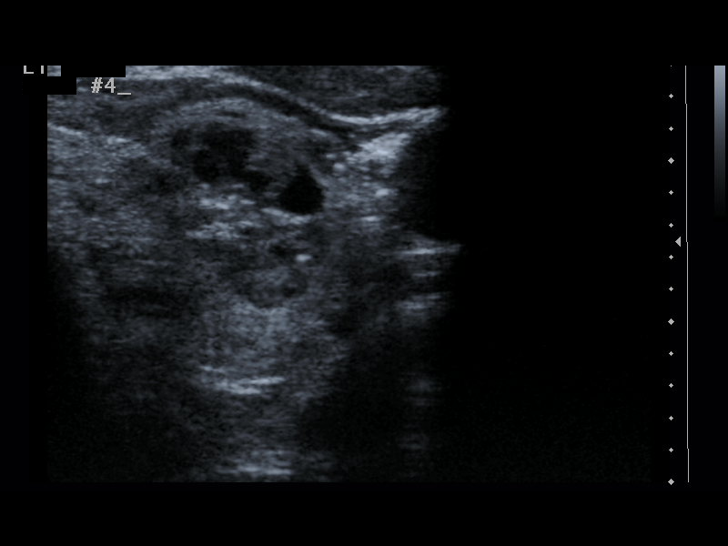
[im 11/14]
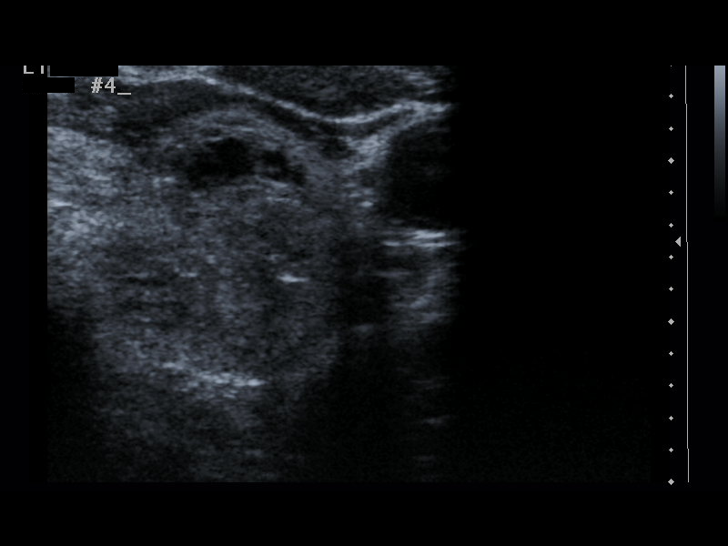
[im 12/14]
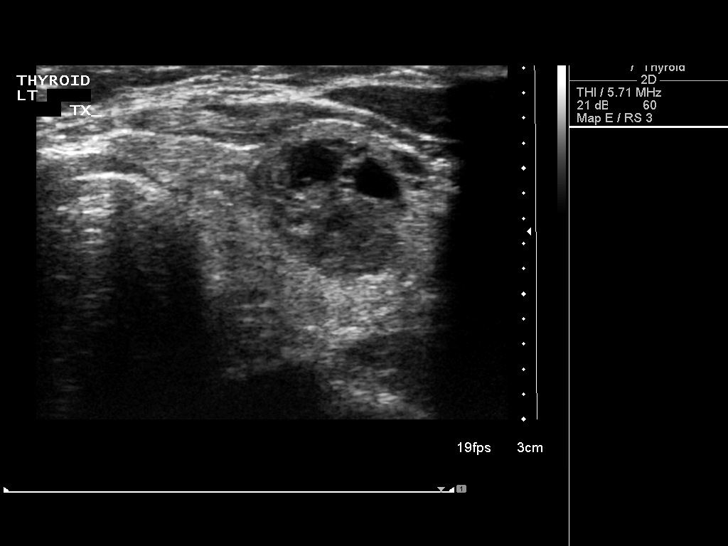
[im 13/14]
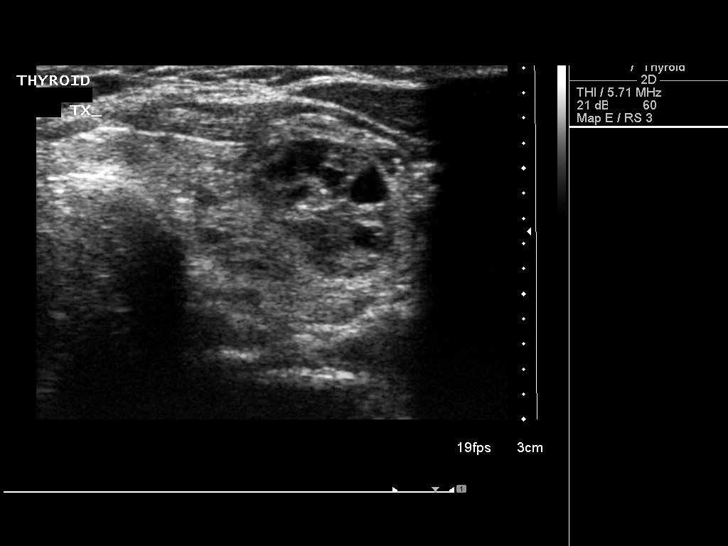
[im 14/14]
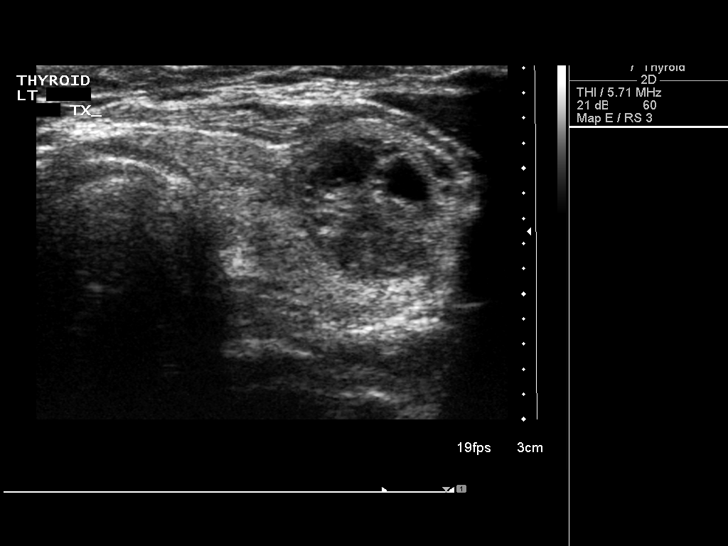

[14 of 14 positions shown; findings below may reference images not displayed]

Thyroid biopsy was thoroughly discussed with the patient and
questions were answered.  The benefits, risks, alternatives, and
complications were also discussed.  The patient understands and
wishes to proceed with the procedure.  Written consent was
obtained.

Ultrasound was performed to localize and mark an adequate site for
the biopsy.  The patient was then prepped and draped in a normal
sterile fashion.  Local anesthesia was provided with 1% lidocaine.
Using direct ultrasound guidance, 4 passes were made using 25 gauge
needles into the nodule within the left lobe of the thyroid.
Ultrasound was used to confirm needle placements on all occasions.
Specimens were sent to Pathology for analysis.

Complications:  No immediate
FINDINGS: Imaging confirms needle placed in the dominant left
inferior thyroid nodule
IMPRESSION: Ultrasound guided needle aspirate biopsy performed of the dominant
left thyroid nodule.

## 2015-01-26 ENCOUNTER — Encounter: Payer: Self-pay | Admitting: Internal Medicine
